# Patient Record
Sex: Male | Born: 1985 | Race: Black or African American | Hispanic: No | Marital: Single | Smoking: Current every day smoker
Health system: Southern US, Community
[De-identification: ages and names within clinical notes are randomized; demographics above are authoritative.]

---
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
## 2011-04-19 ENCOUNTER — Emergency Department (HOSPITAL_BASED_OUTPATIENT_CLINIC_OR_DEPARTMENT_OTHER)
Admission: EM | Admit: 2011-04-19 | Discharge: 2011-04-20 | Disposition: A | Discharge status: Home / Self Care | Payer: 59 | Attending: Emergency Medicine | Admitting: Emergency Medicine

## 2011-04-19 ENCOUNTER — Encounter (HOSPITAL_BASED_OUTPATIENT_CLINIC_OR_DEPARTMENT_OTHER): Payer: Self-pay | Admitting: *Deleted

## 2011-04-19 DIAGNOSIS — R5381 Other malaise: Secondary | ICD-10-CM | POA: Insufficient documentation

## 2011-04-19 DIAGNOSIS — K529 Noninfective gastroenteritis and colitis, unspecified: Secondary | ICD-10-CM

## 2011-04-19 DIAGNOSIS — E860 Dehydration: Secondary | ICD-10-CM | POA: Insufficient documentation

## 2011-04-19 DIAGNOSIS — R1110 Vomiting, unspecified: Secondary | ICD-10-CM | POA: Insufficient documentation

## 2011-04-19 DIAGNOSIS — R531 Weakness: Secondary | ICD-10-CM

## 2011-04-19 DIAGNOSIS — K5289 Other specified noninfective gastroenteritis and colitis: Secondary | ICD-10-CM | POA: Insufficient documentation

## 2011-04-19 LAB — BASIC METABOLIC PANEL
BUN: 17 mg/dL (ref 6–23)
Chloride: 102 mEq/L (ref 96–112)
Creatinine, Ser: 0.9 mg/dL (ref 0.50–1.35)
GFR calc Af Amer: >90 mL/min (ref >90)

## 2011-04-19 LAB — CBC
HCT: 45.8 % (ref 39.0–52.0)
MCH: 29.2 pg (ref 26.0–34.0)
MCV: 81.9 fL (ref 78.0–100.0)
RDW: 12.5 % (ref 11.5–15.5)
WBC: 10.8 10*3/uL — ABNORMAL HIGH (ref 4.0–10.5)

## 2011-04-19 MED ORDER — SODIUM CHLORIDE 0.9 % IV BOLUS (SEPSIS)
1000.0000 mL | Freq: Once | INTRAVENOUS | Status: AC
  Administered 2011-04-20: 1000 mL via INTRAVENOUS

## 2011-04-19 MED ORDER — ONDANSETRON HCL 4 MG/2ML IJ SOLN
4.0000 mg | Freq: Once | INTRAMUSCULAR | Status: AC
  Administered 2011-04-19: 4 mg via INTRAVENOUS
  Filled 2011-04-19: qty 2

## 2011-04-19 MED ORDER — SODIUM CHLORIDE 0.9 % IV BOLUS (SEPSIS)
1000.0000 mL | Freq: Once | INTRAVENOUS | Status: AC
  Administered 2011-04-19 (×2): 1000 mL via INTRAVENOUS
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
## 2011-04-19 NOTE — ED Notes (Signed)
C/o vomiting, diarrhea, weakness for about 4 hours

## 2011-04-20 NOTE — Discharge Instructions (Signed)
Dehydration, Adult Dehydration is when you lose more fluids from the body than you take in. Vital organs like the kidneys, brain, and heart cannot function without a proper amount of fluids and salt. Any loss of fluids from the body can cause dehydration.  CAUSES   Vomiting.   Diarrhea.   Excessive sweating.   Excessive urine output.   Fever.  SYMPTOMS  Mild dehydration  Thirst.   Dry lips.   Slightly dry mouth.  Moderate dehydration  Very dry mouth.   Sunken eyes.   Skin does not bounce back quickly when lightly pinched and released.   Dark urine and decreased urine production.   Decreased tear production.   Headache.  Severe dehydration  Very dry mouth.   Extreme thirst.   Rapid, weak pulse (more than 100 beats per minute at rest).   Cold hands and feet.   Not able to sweat in spite of heat and temperature.   Rapid breathing.   Blue lips.   Confusion and lethargy.   Difficulty being awakened.   Minimal urine production.   No tears.  DIAGNOSIS  Your caregiver will diagnose dehydration based on your symptoms and your exam. Blood and urine tests will help confirm the diagnosis. The diagnostic evaluation should also identify the cause of dehydration. TREATMENT  Treatment of mild or moderate dehydration can often be done at home by increasing the amount of fluids that you drink. It is best to drink small amounts of fluid more often. Drinking too much at one time can make vomiting worse. Refer to the home care instructions below. Severe dehydration needs to be treated at the hospital where you will probably be given intravenous (IV) fluids that contain water and electrolytes. HOME CARE INSTRUCTIONS   Ask your caregiver about specific rehydration instructions.   Drink enough fluids to keep your urine clear or pale yellow.   Drink small amounts frequently if you have nausea and vomiting.   Eat as you normally do.   Avoid:   Foods or drinks high in  sugar.   Carbonated drinks.   Juice.   Extremely hot or cold fluids.   Drinks with caffeine.   Fatty, greasy foods.   Alcohol.   Tobacco.   Overeating.   Gelatin desserts.   Wash your hands well to avoid spreading bacteria and viruses.   Only take over-the-counter or prescription medicines for pain, discomfort, or fever as directed by your caregiver.   Ask your caregiver if you should continue all prescribed and over-the-counter medicines.   Keep all follow-up appointments with your caregiver.  SEEK MEDICAL CARE IF:  You have abdominal pain and it increases or stays in one area (localizes).   You have a rash, stiff neck, or severe headache.   You are irritable, sleepy, or difficult to awaken.   You are weak, dizzy, or extremely thirsty.  SEEK IMMEDIATE MEDICAL CARE IF:   You are unable to keep fluids down or you get worse despite treatment.   You have frequent episodes of vomiting or diarrhea.   You have blood or green matter (bile) in your vomit.   You have blood in your stool or your stool looks black and tarry.   You have not urinated in 6 to 8 hours, or you have only urinated a small amount of very dark urine.   You have a fever.   You faint.  MAKE SURE YOU:   Understand these instructions.   Will watch your condition.     Will get help right away if you are not doing well or get worse.  Document Released: 01/17/2005 Document Revised: 01/06/2011 Document Reviewed: 09/06/2010 Exit[employer] Patient Information 2012 [employer]  Viral Gastroenteritis Viral gastroenteritis is also known as stomach flu. This condition affects the stomach and intestinal tract. It can cause sudden diarrhea and vomiting. The illness typically lasts 3 to 8 days. Most people develop an immune response that eventually gets rid of the virus. While this natural response develops, the virus can make you quite ill. CAUSES  Many different viruses can cause gastroenteritis, such as  rotavirus or noroviruses. You can catch one of these viruses by consuming contaminated food or water. You may also catch a virus by sharing utensils or other personal items with an infected person or by touching a contaminated surface. SYMPTOMS  The most common symptoms are diarrhea and vomiting. These problems can cause a severe loss of body fluids (dehydration) and a body salt (electrolyte) imbalance. Other symptoms may include:  Fever.   Headache.   Fatigue.   Abdominal pain.  DIAGNOSIS  Your caregiver can usually diagnose viral gastroenteritis based on your symptoms and a physical exam. A stool sample may also be taken to test for the presence of viruses or other infections. TREATMENT  This illness typically goes away on its own. Treatments are aimed at rehydration. The most serious cases of viral gastroenteritis involve vomiting so severely that you are not able to keep fluids down. In these cases, fluids must be given through an intravenous line (IV). HOME CARE INSTRUCTIONS   Drink enough fluids to keep your urine clear or pale yellow. Drink small amounts of fluids frequently and increase the amounts as tolerated.   Ask your caregiver for specific rehydration instructions.   Avoid:   Foods high in sugar.   Alcohol.   Carbonated drinks.   Tobacco.   Juice.   Caffeine drinks.   Extremely hot or cold fluids.   Fatty, greasy foods.   Too much intake of anything at one time.   Dairy products until 24 to 48 hours after diarrhea stops.   You may consume probiotics. Probiotics are active cultures of beneficial bacteria. They may lessen the amount and number of diarrheal stools in adults. Probiotics can be found in yogurt with active cultures and in supplements.   Wash your hands well to avoid spreading the virus.   Only take over-the-counter or prescription medicines for pain, discomfort, or fever as directed by your caregiver. Do not give aspirin to children.  Antidiarrheal medicines are not recommended.   Ask your caregiver if you should continue to take your regular prescribed and over-the-counter medicines.   Keep all follow-up appointments as directed by your caregiver.  SEEK IMMEDIATE MEDICAL CARE IF:   You are unable to keep fluids down.   You do not urinate at least once every 6 to 8 hours.   You develop shortness of breath.   You notice blood in your stool or vomit. This may look like coffee grounds.   You have abdominal pain that increases or is concentrated in one small area (localized).   You have persistent vomiting or diarrhea.   You have a fever.   The patient is a child younger than 3 months, and he or she has a fever.   The patient is a child older than 3 months, and he or she has a fever and persistent symptoms.   The patient is a child older than 3 months, and   he or she has a fever and symptoms suddenly get worse.   The patient is a baby, and he or she has no tears when crying.  MAKE SURE YOU:   Understand these instructions.   Will watch your condition.   Will get help right away if you are not doing well or get worse.  Document Released: 01/17/2005 Document Revised: 01/06/2011 Document Reviewed: 11/03/2010 [employer] Patient Information 2012 [employer]. 

## 2012-05-09 ENCOUNTER — Emergency Department (HOSPITAL_BASED_OUTPATIENT_CLINIC_OR_DEPARTMENT_OTHER): Payer: 59

## 2012-05-09 ENCOUNTER — Emergency Department (HOSPITAL_BASED_OUTPATIENT_CLINIC_OR_DEPARTMENT_OTHER)
Admission: EM | Admit: 2012-05-09 | Discharge: 2012-05-09 | Disposition: A | Discharge status: Home / Self Care | Payer: 59 | Attending: Emergency Medicine | Admitting: Emergency Medicine

## 2012-05-09 DIAGNOSIS — X58XXXA Exposure to other specified factors, initial encounter: Secondary | ICD-10-CM | POA: Insufficient documentation

## 2012-05-09 DIAGNOSIS — F17200 Nicotine dependence, unspecified, uncomplicated: Secondary | ICD-10-CM | POA: Insufficient documentation

## 2012-05-09 DIAGNOSIS — S93402A Sprain of unspecified ligament of left ankle, initial encounter: Secondary | ICD-10-CM

## 2012-05-09 DIAGNOSIS — Y939 Activity, unspecified: Secondary | ICD-10-CM | POA: Insufficient documentation

## 2012-05-09 DIAGNOSIS — Y9289 Other specified places as the place of occurrence of the external cause: Secondary | ICD-10-CM | POA: Insufficient documentation

## 2012-05-09 DIAGNOSIS — S93409A Sprain of unspecified ligament of unspecified ankle, initial encounter: Secondary | ICD-10-CM | POA: Insufficient documentation

## 2012-05-09 NOTE — ED Provider Notes (Signed)
History     CSN: [id number]  Arrival date & time 05/09/12  1032   First MD Initiated Contact with Patient 05/09/12 1127      Chief Complaint  Patient presents with  . Leg Swelling    (Consider location/radiation/quality/duration/timing/severity/associated sxs/prior treatment) Patient is a 27 y.o. male presenting with ankle pain. The history is provided by the patient. No language interpreter was used.  Ankle Pain Location:  Ankle Time since incident: Patient complains of pain and swelling in the left ankle. He has been on a new job for 3 weeks, in which she does a lot of walking. No specific injury. Injury: no   Pain details:    Quality:  Aching   Radiates to:  Does not radiate   Severity:  Moderate   Onset quality:  Gradual   Duration:  3 weeks   Timing:  Constant Chronicity:  New Dislocation: no   Foreign body present:  No foreign bodies Prior injury to area: He may have had ankle in the past. Relieved by:  Nothing Worsened by:  Nothing tried Associated symptoms comment:  None.   No past medical history on file.  No past surgical history on file.  No family history on file.  History  Substance Use Topics  . Smoking status: Current Every Day Smoker  . Smokeless tobacco: Not on file  . Alcohol Use: Yes      Review of Systems  All other systems reviewed and are negative.    Allergies  Review of patient's allergies indicates no known allergies.  Home Medications   Current Outpatient Rx  Name  Route  Sig  Dispense  Refill  . Ibuprofen (ADVIL PO)   Oral   Take by mouth.           BP 114/77  Pulse 72  Temp(Src) 98.2 F (36.8 C) (Oral)  Resp 16  Ht 5\' 11"  (1.803 m)  Wt 165 lb (74.844 kg)  BMI 23.02 kg/m2  SpO2 100%  Physical Exam  Constitutional: He is oriented to person, place, and time. He appears well-developed and well-nourished. No distress.  Musculoskeletal:  He localizes pain and swelling to the medial malleolus of the left ankle. It  does look mildly swollen. There is no calf tenderness no Homans sign. He has intact pulses sensation and tendon function in the left foot. Skin is intact without a rash or other injury.  Neurological: He is alert and oriented to person, place, and time.  No sensory or motor deficit.  Skin: Skin is warm and dry.  Psychiatric: He has a normal mood and affect. His behavior is normal.    ED Course  Procedures (including critical care time)  11:37 AM Pt was seen and had focussed physical exam. X-rays of left ankle ordered.   1:50 PM Results for orders placed during the hospital encounter of 04/19/11  CBC      Result Value Range   WBC 10.8 (*) 4.0 - 10.5 K/uL   RBC 5.59  4.22 - 5.81 MIL/uL   Hemoglobin 16.3  13.0 - 17.0 g/dL   HCT 40.9  81.1 - 91.4 %   MCV 81.9  78.0 - 100.0 fL   MCH 29.2  26.0 - 34.0 pg   MCHC 35.6  30.0 - 36.0 g/dL   RDW 78.2  95.6 - 21.3 %   Platelets 206  150 - 400 K/uL  BASIC METABOLIC PANEL      Result Value Range   Sodium 140  135 - 145 mEq/L   Potassium 3.8  3.5 - 5.1 mEq/L   Chloride 102  96 - 112 mEq/L   CO2 21  19 - 32 mEq/L   Glucose, Bld 103 (*) 70 - 99 mg/dL   BUN 17  6 - 23 mg/dL   Creatinine, Ser 8.29  0.50 - 1.35 mg/dL   Calcium 56.2  8.4 - 13.0 mg/dL   GFR calc non Af Amer >90  >90 mL/min   GFR calc Af Amer >90  >90 mL/min   Dg Ankle Complete Left  05/09/2012  *RADIOLOGY REPORT*  Clinical Data: Medial left ankle pain.  LEFT ANKLE COMPLETE - 3+ VIEW  Comparison: None.  Findings: Medial soft tissue swelling without underlying acute osseous abnormality.  IMPRESSION: Medial soft tissue swelling without acute osseous abnormality.   Original Report Authenticated By: Leanna Battles, M.D.     X-rays showed soft tissue swelling and no fracture.  Advised ASO when up, elevation and ice when at rest.   1. Sprain of left ankle, initial encounter        Carleene Cooper III, MD 05/09/12 1353

## 2014-05-16 IMAGING — CR DG ANKLE COMPLETE 3+V*L*
3 series · 3 of 3 positions shown · non-contrast
Comparison: None.

CLINICAL DATA: Medial left ankle pain.

LEFT ANKLE COMPLETE - 3+ VIEW

[t ankle joint ap left]
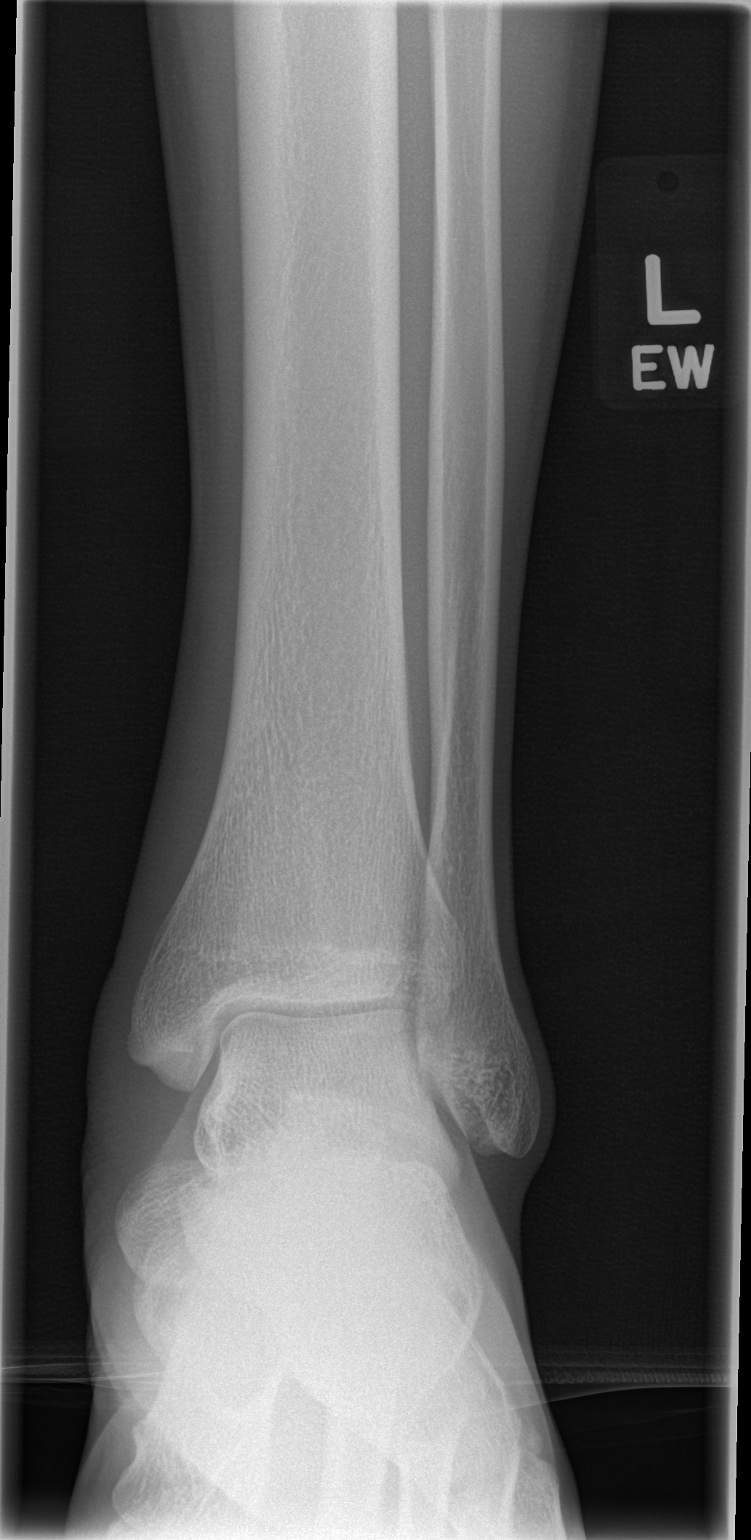

[t ankle joint oblique left]
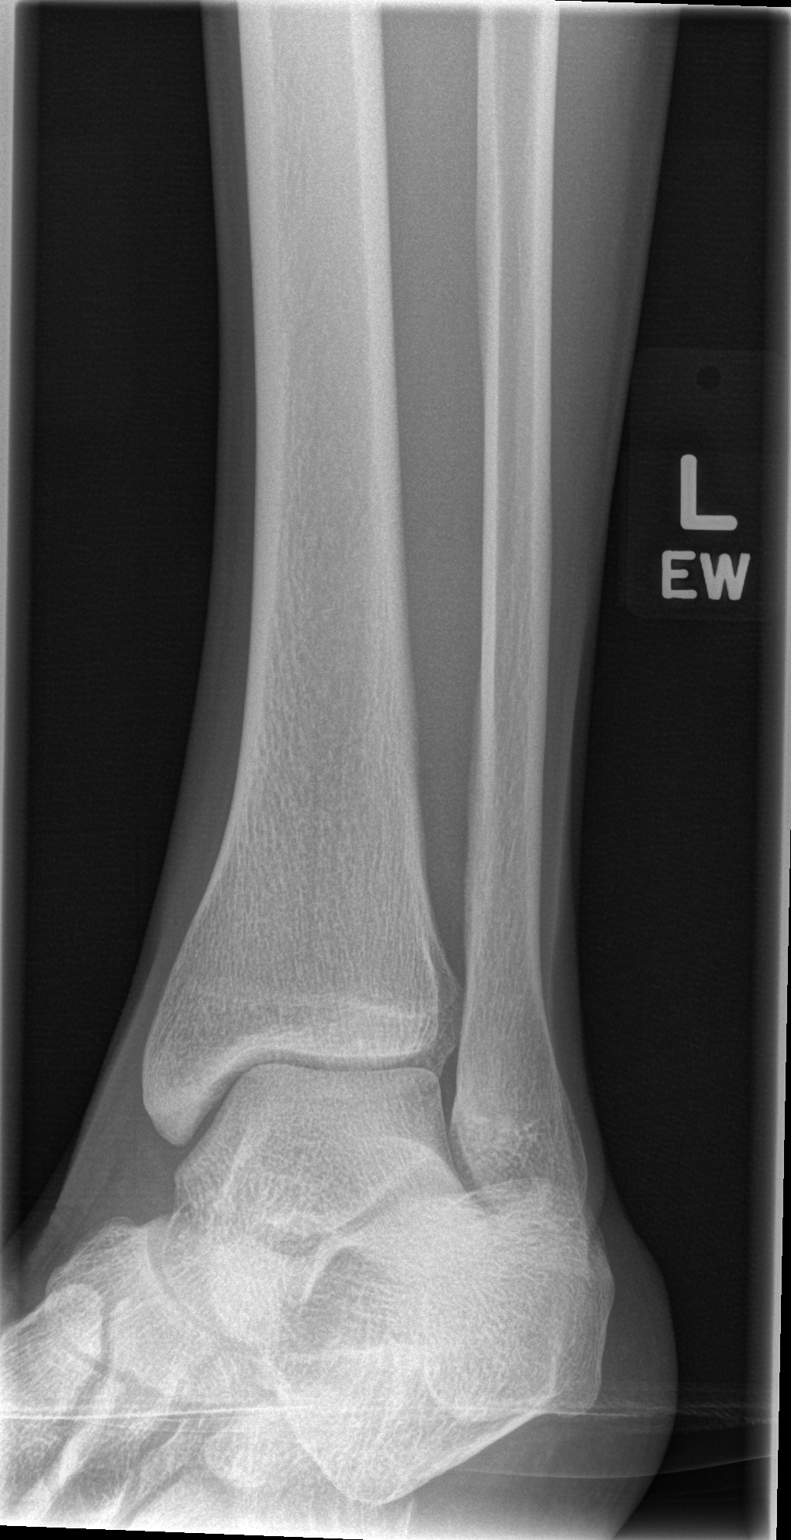

[t ankle joint lat left]
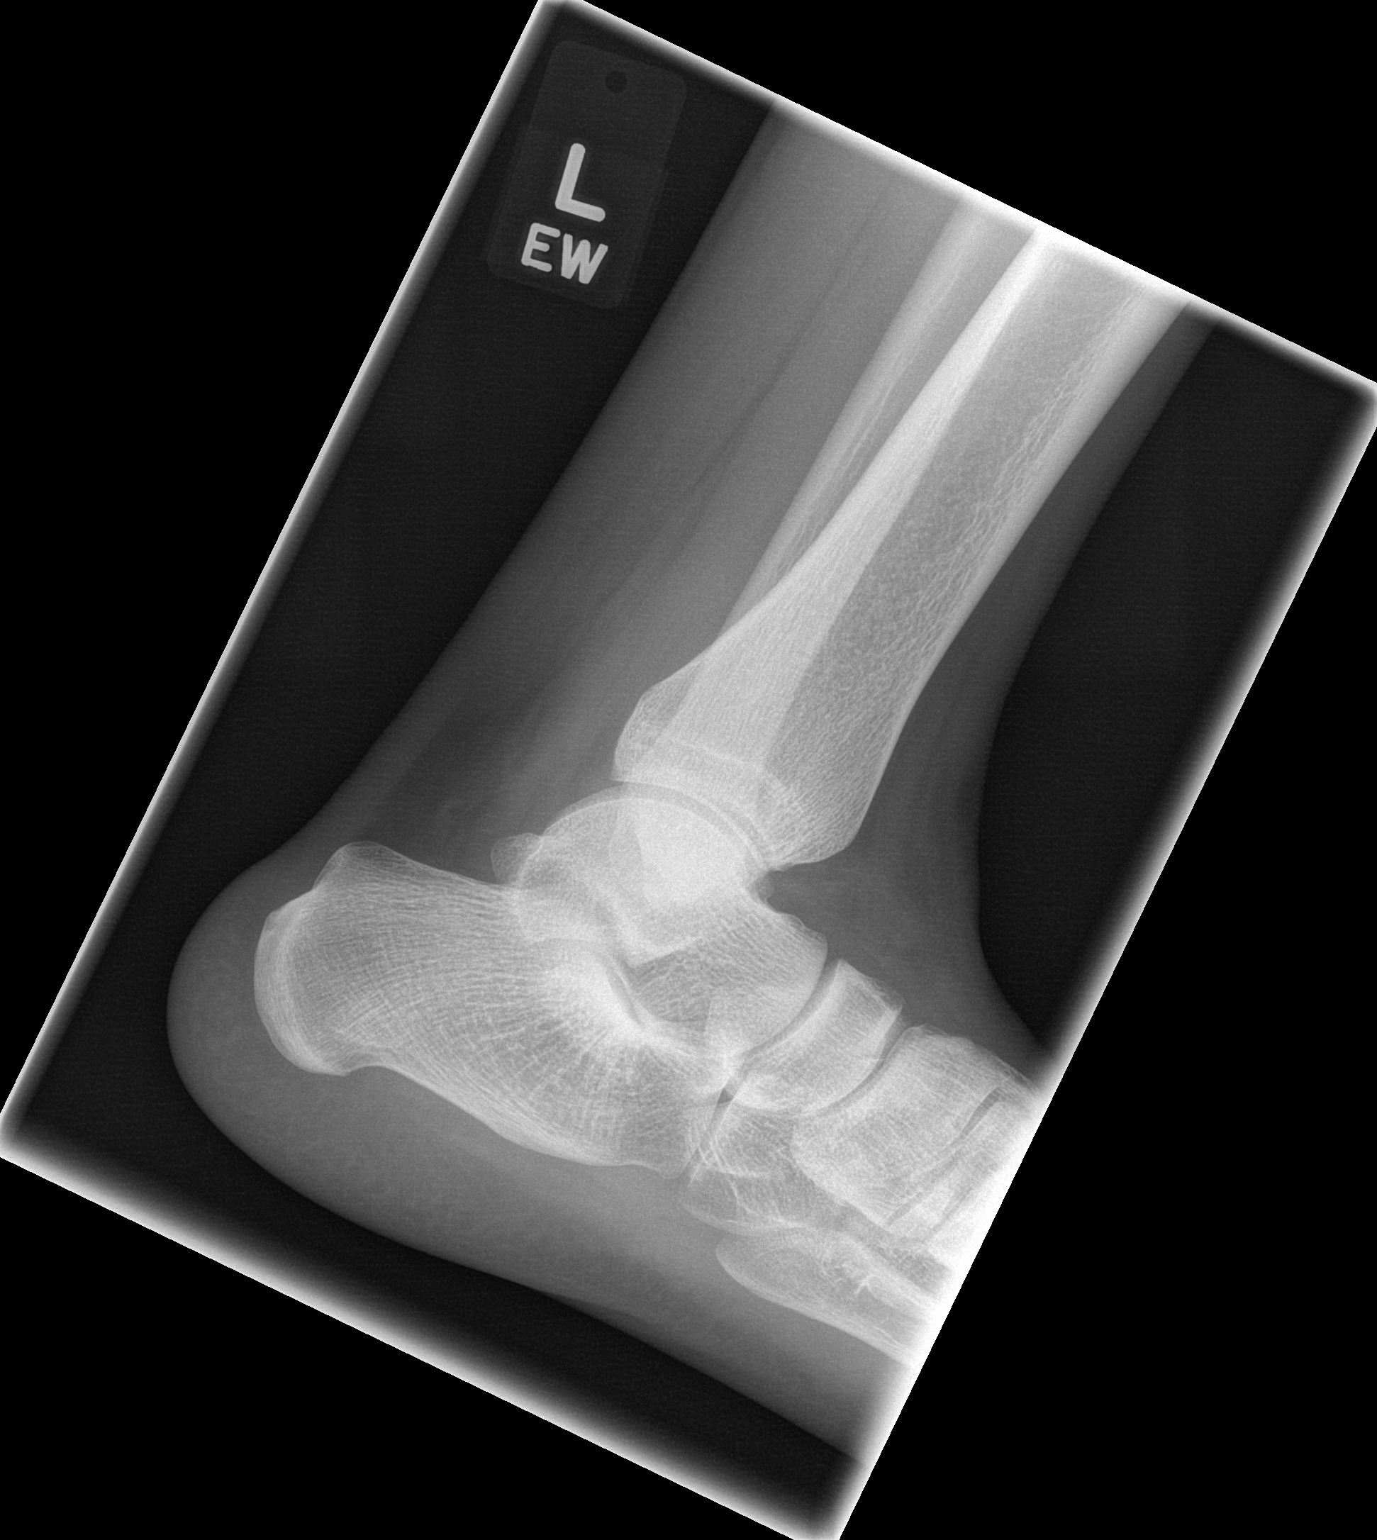

[3 of 3 positions shown; findings below may reference images not displayed]

FINDINGS: Medial soft tissue swelling without underlying acute
osseous abnormality.
IMPRESSION: Medial soft tissue swelling without acute osseous abnormality.

## 2018-12-22 ENCOUNTER — Encounter (HOSPITAL_COMMUNITY): Payer: Self-pay

## 2018-12-22 ENCOUNTER — Emergency Department (HOSPITAL_COMMUNITY)
Admission: EM | Admit: 2018-12-22 | Discharge: 2018-12-23 | Disposition: A | Discharge status: Home / Self Care | Payer: Self-pay | Attending: Emergency Medicine | Admitting: Emergency Medicine

## 2018-12-22 ENCOUNTER — Emergency Department (HOSPITAL_COMMUNITY): Payer: Self-pay

## 2018-12-22 DIAGNOSIS — S81801A Unspecified open wound, right lower leg, initial encounter: Secondary | ICD-10-CM | POA: Insufficient documentation

## 2018-12-22 DIAGNOSIS — Z23 Encounter for immunization: Secondary | ICD-10-CM | POA: Insufficient documentation

## 2018-12-22 DIAGNOSIS — Y9289 Other specified places as the place of occurrence of the external cause: Secondary | ICD-10-CM | POA: Insufficient documentation

## 2018-12-22 DIAGNOSIS — W3400XA Accidental discharge from unspecified firearms or gun, initial encounter: Secondary | ICD-10-CM

## 2018-12-22 DIAGNOSIS — F17210 Nicotine dependence, cigarettes, uncomplicated: Secondary | ICD-10-CM | POA: Insufficient documentation

## 2018-12-22 DIAGNOSIS — Y939 Activity, unspecified: Secondary | ICD-10-CM | POA: Insufficient documentation

## 2018-12-22 DIAGNOSIS — T07XXXA Unspecified multiple injuries, initial encounter: Secondary | ICD-10-CM

## 2018-12-22 DIAGNOSIS — Y999 Unspecified external cause status: Secondary | ICD-10-CM | POA: Insufficient documentation

## 2018-12-22 DIAGNOSIS — S81802A Unspecified open wound, left lower leg, initial encounter: Secondary | ICD-10-CM | POA: Insufficient documentation

## 2018-12-22 MED ORDER — TETANUS-DIPHTH-ACELL PERTUSSIS 5-2.5-18.5 LF-MCG/0.5 IM SUSP
0.5000 mL | Freq: Once | INTRAMUSCULAR | Status: AC
  Administered 2018-12-23: 0.5 mL via INTRAMUSCULAR
  Filled 2018-12-22: qty 0.5

## 2018-12-22 MED ORDER — FENTANYL CITRATE (PF) 100 MCG/2ML IJ SOLN
INTRAMUSCULAR | Status: AC
  Administered 2018-12-22: 50 ug
  Filled 2018-12-22: qty 2
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
## 2018-12-23 ENCOUNTER — Emergency Department (HOSPITAL_COMMUNITY): Payer: Self-pay

## 2018-12-23 LAB — COMPREHENSIVE METABOLIC PANEL
ALT: 14 U/L (ref 0–44)
AST: 23 U/L (ref 15–41)
Albumin: 4.8 g/dL (ref 3.5–5.0)
Alkaline Phosphatase: 60 U/L (ref 38–126)
Anion gap: 15 (ref 5–15)
BUN: 11 mg/dL (ref 6–20)
CO2: 22 mmol/L (ref 22–32)
Calcium: 9.7 mg/dL (ref 8.9–10.3)
Chloride: 104 mmol/L (ref 98–111)
Creatinine, Ser: 1.21 mg/dL (ref 0.61–1.24)
GFR calc Af Amer: >60 mL/min (ref >60)
GFR calc non Af Amer: >60 mL/min (ref >60)
Glucose, Bld: 110 mg/dL — ABNORMAL HIGH (ref 70–99)
Potassium: 3.3 mmol/L — ABNORMAL LOW (ref 3.5–5.1)
Sodium: 141 mmol/L (ref 135–145)
Total Bilirubin: 0.7 mg/dL (ref 0.3–1.2)
Total Protein: 7.8 g/dL (ref 6.5–8.1)

## 2018-12-23 LAB — I-STAT CHEM 8, ED
BUN: 10 mg/dL (ref 6–20)
Calcium, Ion: 1.1 mmol/L — ABNORMAL LOW (ref 1.15–1.40)
Chloride: 103 mmol/L (ref 98–111)
Creatinine, Ser: 1.4 mg/dL — ABNORMAL HIGH (ref 0.61–1.24)
Glucose, Bld: 106 mg/dL — ABNORMAL HIGH (ref 70–99)
HCT: 43 % (ref 39.0–52.0)
Hemoglobin: 14.6 g/dL (ref 13.0–17.0)
Potassium: 3.2 mmol/L — ABNORMAL LOW (ref 3.5–5.1)
Sodium: 142 mmol/L (ref 135–145)
TCO2: 23 mmol/L (ref 22–32)

## 2018-12-23 LAB — CBC
HCT: 41.3 % (ref 39.0–52.0)
Hemoglobin: 14 g/dL (ref 13.0–17.0)
MCH: 28.4 pg (ref 26.0–34.0)
MCHC: 33.9 g/dL (ref 30.0–36.0)
MCV: 83.8 fL (ref 80.0–100.0)
Platelets: 239 10*3/uL (ref 150–400)
RBC: 4.93 MIL/uL (ref 4.22–5.81)
RDW: 12.3 % (ref 11.5–15.5)
WBC: 5.9 10*3/uL (ref 4.0–10.5)
nRBC: 0 % (ref 0.0–0.2)

## 2018-12-23 LAB — SAMPLE TO BLOOD BANK

## 2018-12-23 LAB — CDS SEROLOGY

## 2018-12-23 LAB — PROTIME-INR
INR: 1 (ref 0.8–1.2)
Prothrombin Time: 13.4 seconds (ref 11.4–15.2)

## 2018-12-23 LAB — LACTIC ACID, PLASMA: Lactic Acid, Venous: 4.7 mmol/L (ref 0.5–1.9)

## 2018-12-23 LAB — ETHANOL: Alcohol, Ethyl (B): 181 mg/dL — ABNORMAL HIGH (ref <10)

## 2018-12-23 MED ORDER — HYDROCODONE-ACETAMINOPHEN 5-325 MG PO TABS
1.0000 | ORAL_TABLET | Freq: Once | ORAL | Status: AC
  Administered 2018-12-23: 01:00:00 1 via ORAL
  Filled 2018-12-23: qty 1
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
